# Patient Record
Sex: Female | Born: 1935 | Race: White | Hispanic: No | Marital: Married
Health system: Southern US, Community
[De-identification: ages and names within clinical notes are randomized; demographics above are authoritative.]

---

## 2004-01-19 ENCOUNTER — Inpatient Hospital Stay (HOSPITAL_COMMUNITY): Admission: AD | Admit: 2004-01-19 | Discharge: 2004-01-22 | Payer: Self-pay | Admitting: Cardiology

## 2004-01-19 ENCOUNTER — Ambulatory Visit: Payer: Self-pay | Admitting: Cardiovascular Disease

## 2004-02-13 ENCOUNTER — Encounter: Payer: Self-pay | Admitting: Cardiovascular Disease

## 2004-03-06 ENCOUNTER — Encounter: Payer: Self-pay | Admitting: Cardiovascular Disease

## 2004-04-03 ENCOUNTER — Encounter: Payer: Self-pay | Admitting: Cardiovascular Disease

## 2004-04-16 ENCOUNTER — Ambulatory Visit: Payer: Self-pay | Admitting: Internal Medicine

## 2004-05-04 ENCOUNTER — Encounter: Payer: Self-pay | Admitting: Cardiovascular Disease

## 2004-05-13 ENCOUNTER — Encounter: Payer: Self-pay | Admitting: Cardiovascular Disease

## 2004-06-03 ENCOUNTER — Encounter: Payer: Self-pay | Admitting: Cardiovascular Disease

## 2004-07-04 ENCOUNTER — Encounter: Payer: Self-pay | Admitting: Cardiovascular Disease

## 2004-08-03 ENCOUNTER — Encounter: Payer: Self-pay | Admitting: Cardiovascular Disease

## 2004-09-03 ENCOUNTER — Encounter: Payer: Self-pay | Admitting: Cardiovascular Disease

## 2004-10-04 ENCOUNTER — Encounter: Payer: Self-pay | Admitting: Cardiovascular Disease

## 2004-11-03 ENCOUNTER — Encounter: Payer: Self-pay | Admitting: Cardiovascular Disease

## 2004-12-04 ENCOUNTER — Encounter: Payer: Self-pay | Admitting: Cardiovascular Disease

## 2005-01-03 ENCOUNTER — Encounter: Payer: Self-pay | Admitting: Cardiovascular Disease

## 2005-01-17 ENCOUNTER — Ambulatory Visit: Payer: Self-pay | Admitting: Internal Medicine

## 2005-02-03 ENCOUNTER — Encounter: Payer: Self-pay | Admitting: Cardiovascular Disease

## 2005-03-06 ENCOUNTER — Encounter: Payer: Self-pay | Admitting: Cardiovascular Disease

## 2005-04-03 ENCOUNTER — Encounter: Payer: Self-pay | Admitting: Cardiovascular Disease

## 2005-05-04 ENCOUNTER — Encounter: Payer: Self-pay | Admitting: Cardiovascular Disease

## 2005-06-03 ENCOUNTER — Encounter: Payer: Self-pay | Admitting: Cardiovascular Disease

## 2005-07-04 ENCOUNTER — Encounter: Payer: Self-pay | Admitting: Cardiovascular Disease

## 2005-08-03 ENCOUNTER — Encounter: Payer: Self-pay | Admitting: Cardiovascular Disease

## 2005-09-03 ENCOUNTER — Encounter: Payer: Self-pay | Admitting: Cardiovascular Disease

## 2005-09-10 ENCOUNTER — Ambulatory Visit: Payer: Self-pay | Admitting: Infectious Diseases

## 2005-10-04 ENCOUNTER — Encounter: Payer: Self-pay | Admitting: Cardiovascular Disease

## 2006-09-30 ENCOUNTER — Ambulatory Visit: Payer: Self-pay | Admitting: Obstetrics and Gynecology

## 2006-11-17 ENCOUNTER — Ambulatory Visit: Payer: Self-pay

## 2006-11-19 ENCOUNTER — Ambulatory Visit: Payer: Self-pay | Admitting: Obstetrics and Gynecology

## 2006-12-04 ENCOUNTER — Inpatient Hospital Stay: Payer: Self-pay | Admitting: Obstetrics and Gynecology

## 2007-07-19 ENCOUNTER — Ambulatory Visit: Payer: Self-pay | Admitting: Internal Medicine

## 2007-10-07 ENCOUNTER — Ambulatory Visit: Payer: Self-pay | Admitting: Obstetrics and Gynecology

## 2007-12-10 ENCOUNTER — Encounter: Payer: Self-pay | Admitting: Unknown Physician Specialty

## 2008-06-13 ENCOUNTER — Ambulatory Visit: Payer: Self-pay | Admitting: Obstetrics and Gynecology

## 2008-10-26 ENCOUNTER — Ambulatory Visit: Payer: Self-pay | Admitting: Internal Medicine

## 2009-02-10 ENCOUNTER — Emergency Department: Payer: Self-pay | Admitting: Internal Medicine

## 2009-03-23 ENCOUNTER — Ambulatory Visit: Payer: Self-pay | Admitting: Internal Medicine

## 2009-07-10 ENCOUNTER — Emergency Department: Payer: Self-pay | Admitting: Internal Medicine

## 2009-09-05 ENCOUNTER — Ambulatory Visit: Payer: Self-pay | Admitting: Internal Medicine

## 2010-12-03 ENCOUNTER — Emergency Department: Payer: Self-pay | Admitting: Emergency Medicine

## 2010-12-09 ENCOUNTER — Inpatient Hospital Stay: Payer: Self-pay | Admitting: Vascular Surgery

## 2011-03-07 ENCOUNTER — Ambulatory Visit: Payer: Self-pay | Admitting: Vascular Surgery

## 2011-03-07 LAB — BASIC METABOLIC PANEL
Anion Gap: 13 (ref 7–16)
BUN: 10 mg/dL (ref 7–18)
Calcium, Total: 9.4 mg/dL (ref 8.5–10.1)
EGFR (African American): >60
EGFR (Non-African Amer.): >60
Glucose: 87 mg/dL (ref 65–99)
Osmolality: 289 (ref 275–301)
Sodium: 146 mmol/L — ABNORMAL HIGH (ref 136–145)

## 2011-11-25 ENCOUNTER — Ambulatory Visit: Payer: Self-pay | Admitting: Vascular Surgery

## 2011-11-25 LAB — CREATININE, SERUM
EGFR (African American): >60
EGFR (Non-African Amer.): >60

## 2011-11-25 LAB — BUN: BUN: 10 mg/dL (ref 7–18)

## 2012-01-04 ENCOUNTER — Ambulatory Visit: Payer: Self-pay | Admitting: Internal Medicine

## 2012-01-25 ENCOUNTER — Inpatient Hospital Stay: Payer: Self-pay | Admitting: Vascular Surgery

## 2012-01-25 LAB — APTT
Activated PTT: 28.1 secs (ref 23.6–35.9)
Activated PTT: 71.7 secs — ABNORMAL HIGH (ref 23.6–35.9)

## 2012-01-25 LAB — CBC WITH DIFFERENTIAL/PLATELET
Basophil #: 0.1 10*3/uL (ref 0.0–0.1)
Basophil %: 0.8 %
Eosinophil #: 0 10*3/uL (ref 0.0–0.7)
HCT: 46.5 % (ref 35.0–47.0)
Lymphocyte %: 25.6 %
MCH: 32.2 pg (ref 26.0–34.0)
MCHC: 34.2 g/dL (ref 32.0–36.0)
Monocyte #: 1.1 x10 3/mm — ABNORMAL HIGH (ref 0.2–0.9)
Neutrophil %: 62.5 %
Platelet: 235 10*3/uL (ref 150–440)
RDW: 13.7 % (ref 11.5–14.5)
WBC: 9.9 10*3/uL (ref 3.6–11.0)

## 2012-01-25 LAB — CBC
HCT: 46.7 % (ref 35.0–47.0)
MCH: 32.2 pg (ref 26.0–34.0)
MCHC: 34.2 g/dL (ref 32.0–36.0)
MCV: 94 fL (ref 80–100)
Platelet: 213 10*3/uL (ref 150–440)
RDW: 13.4 % (ref 11.5–14.5)
WBC: 8.6 10*3/uL (ref 3.6–11.0)

## 2012-01-25 LAB — BASIC METABOLIC PANEL
BUN: 8 mg/dL (ref 7–18)
Calcium, Total: 9.5 mg/dL (ref 8.5–10.1)
Chloride: 100 mmol/L (ref 98–107)
Co2: 28 mmol/L (ref 21–32)
Creatinine: 0.53 mg/dL — ABNORMAL LOW (ref 0.60–1.30)
EGFR (African American): >60
EGFR (Non-African Amer.): >60
Glucose: 110 mg/dL — ABNORMAL HIGH (ref 65–99)
Potassium: 3.6 mmol/L (ref 3.5–5.1)
Sodium: 137 mmol/L (ref 136–145)

## 2012-01-25 LAB — PROTIME-INR
INR: 0.9
Prothrombin Time: 12.2 secs (ref 11.5–14.7)

## 2012-01-26 LAB — PROTIME-INR
INR: 0.8
Prothrombin Time: 11.8 secs (ref 11.5–14.7)

## 2012-01-26 LAB — CBC WITH DIFFERENTIAL/PLATELET
Basophil #: 0.1 10*3/uL (ref 0.0–0.1)
Basophil %: 0.6 %
Eosinophil %: 0 %
HCT: 42.6 % (ref 35.0–47.0)
HGB: 14.6 g/dL (ref 12.0–16.0)
Lymphocyte #: 0.9 10*3/uL — ABNORMAL LOW (ref 1.0–3.6)
Lymphocyte %: 17.7 %
MCHC: 34.1 g/dL (ref 32.0–36.0)
MCHC: 33.1 g/dL (ref 32.0–36.0)
MCV: 95 fL (ref 80–100)
MCV: 94 fL (ref 80–100)
Monocyte %: 7.5 %
Neutrophil #: 7 10*3/uL — ABNORMAL HIGH (ref 1.4–6.5)
Neutrophil #: 10.9 10*3/uL — ABNORMAL HIGH (ref 1.4–6.5)
Neutrophil %: 70.1 %
Platelet: 204 10*3/uL (ref 150–440)
Platelet: 182 10*3/uL (ref 150–440)
RBC: 4.5 10*6/uL (ref 3.80–5.20)
RBC: 4.52 10*6/uL (ref 3.80–5.20)
RDW: 13.8 % (ref 11.5–14.5)
RDW: 13.4 % (ref 11.5–14.5)
WBC: 13.1 10*3/uL — ABNORMAL HIGH (ref 3.6–11.0)

## 2012-01-26 LAB — BASIC METABOLIC PANEL
Calcium, Total: 9 mg/dL (ref 8.5–10.1)
Co2: 31 mmol/L (ref 21–32)
EGFR (African American): >60
Osmolality: 275 (ref 275–301)
Sodium: 138 mmol/L (ref 136–145)

## 2012-01-26 LAB — APTT: Activated PTT: 28.8 secs (ref 23.6–35.9)

## 2012-01-26 LAB — HEMOGLOBIN: HGB: 6.5 g/dL — ABNORMAL LOW (ref 12.0–16.0)

## 2012-01-27 LAB — BASIC METABOLIC PANEL
Anion Gap: 7 (ref 7–16)
BUN: 9 mg/dL (ref 7–18)
Calcium, Total: 7.6 mg/dL — ABNORMAL LOW (ref 8.5–10.1)
Co2: 28 mmol/L (ref 21–32)
EGFR (African American): >60
Sodium: 142 mmol/L (ref 136–145)

## 2012-01-27 LAB — CBC WITH DIFFERENTIAL/PLATELET
Comment - H1-Com2: NORMAL
HCT: 30.1 % — ABNORMAL LOW (ref 35.0–47.0)
HGB: 10.1 g/dL — ABNORMAL LOW (ref 12.0–16.0)
Lymphocytes: 13 %
MCHC: 33.6 g/dL (ref 32.0–36.0)
MCV: 88 fL (ref 80–100)
Monocytes: 13 %
RBC: 3.42 10*6/uL — ABNORMAL LOW (ref 3.80–5.20)
Segmented Neutrophils: 59 %
Variant Lymphocyte - H1-Rlymph: 9 %

## 2012-01-28 LAB — CBC WITH DIFFERENTIAL/PLATELET
Basophil #: 0 10*3/uL (ref 0.0–0.1)
Eosinophil %: 0.4 %
HCT: 23.1 % — ABNORMAL LOW (ref 35.0–47.0)
HGB: 8.1 g/dL — ABNORMAL LOW (ref 12.0–16.0)
Lymphocyte %: 17.7 %
Monocyte #: 1.8 x10 3/mm — ABNORMAL HIGH (ref 0.2–0.9)
Monocyte %: 14.8 %
Neutrophil #: 8.1 10*3/uL — ABNORMAL HIGH (ref 1.4–6.5)
RBC: 2.63 10*6/uL — ABNORMAL LOW (ref 3.80–5.20)
RDW: 16.6 % — ABNORMAL HIGH (ref 11.5–14.5)

## 2012-01-28 LAB — COMPREHENSIVE METABOLIC PANEL
Anion Gap: 4 — ABNORMAL LOW (ref 7–16)
Calcium, Total: 7.6 mg/dL — ABNORMAL LOW (ref 8.5–10.1)
Co2: 31 mmol/L (ref 21–32)
EGFR (Non-African Amer.): >60
Glucose: 106 mg/dL — ABNORMAL HIGH (ref 65–99)
Osmolality: 283 (ref 275–301)
Potassium: 3.7 mmol/L (ref 3.5–5.1)
SGOT(AST): 71 U/L — ABNORMAL HIGH (ref 15–37)
Sodium: 143 mmol/L (ref 136–145)

## 2012-01-28 LAB — APTT
Activated PTT: 45.1 secs — ABNORMAL HIGH (ref 23.6–35.9)
Activated PTT: 43.6 secs — ABNORMAL HIGH (ref 23.6–35.9)
Activated PTT: 51.8 secs — ABNORMAL HIGH (ref 23.6–35.9)
Activated PTT: 40.2 secs — ABNORMAL HIGH (ref 23.6–35.9)

## 2012-01-29 LAB — CBC WITH DIFFERENTIAL/PLATELET
Basophil #: 0 10*3/uL (ref 0.0–0.1)
Eosinophil #: 0 10*3/uL (ref 0.0–0.7)
Eosinophil %: 0 %
HCT: 24 % — ABNORMAL LOW (ref 35.0–47.0)
HGB: 8.2 g/dL — ABNORMAL LOW (ref 12.0–16.0)
Lymphocyte #: 1.3 10*3/uL (ref 1.0–3.6)
Lymphocyte %: 8.3 %
MCHC: 34 g/dL (ref 32.0–36.0)
Monocyte %: 11.6 %
Neutrophil #: 12 10*3/uL — ABNORMAL HIGH (ref 1.4–6.5)
Neutrophil %: 79.8 %
Platelet: 156 10*3/uL (ref 150–440)
RDW: 16.3 % — ABNORMAL HIGH (ref 11.5–14.5)
WBC: 15.1 10*3/uL — ABNORMAL HIGH (ref 3.6–11.0)

## 2012-01-29 LAB — BASIC METABOLIC PANEL
Anion Gap: 7 (ref 7–16)
BUN: 7 mg/dL (ref 7–18)
Calcium, Total: 8 mg/dL — ABNORMAL LOW (ref 8.5–10.1)
Chloride: 102 mmol/L (ref 98–107)
Creatinine: 0.54 mg/dL — ABNORMAL LOW (ref 0.60–1.30)
EGFR (African American): >60
EGFR (Non-African Amer.): >60
Osmolality: 277 (ref 275–301)
Potassium: 3.6 mmol/L (ref 3.5–5.1)
Sodium: 139 mmol/L (ref 136–145)

## 2012-01-29 LAB — APTT
Activated PTT: 37.2 secs — ABNORMAL HIGH (ref 23.6–35.9)
Activated PTT: 123.2 secs — ABNORMAL HIGH (ref 23.6–35.9)

## 2012-01-30 LAB — APTT: Activated PTT: >160 secs (ref 23.6–35.9)

## 2012-02-04 ENCOUNTER — Ambulatory Visit: Payer: Self-pay | Admitting: Internal Medicine

## 2012-02-04 DEATH — deceased

## 2014-05-23 NOTE — Op Note (Signed)
PATIENT NAME:  [name] Russell, [name] Russell MR#:  [id number] DATE OF BIRTH:  Apr 10, 1935  DATE OF PROCEDURE:  11/25/2011  PREOPERATIVE DIAGNOSIS: Atherosclerotic occlusive disease bilateral lower extremities with rest pain symptoms and lifestyle limiting claudication.   POSTOPERATIVE DIAGNOSIS: Atherosclerotic occlusive disease bilateral lower extremities with rest pain symptoms and lifestyle limiting claudication.   PROCEDURES PERFORMED:  1. Abdominal aortogram.  2. Right lower extremity distal runoff, third order catheter placement.  3. Percutaneous transluminal angioplasty to 3 mm right peroneal artery.  4. Crosser atherectomy right superficial femoral artery and popliteal artery.  5. Percutaneous transluminal angioplasty to 6 mm right superficial femoral and popliteal artery.   SURGEON: Renford Dills, MD  SEDATION: Versed 4 mg plus fentanyl 150 mcg administered IV. Continuous ECG, pulse oximetry and cardiopulmonary monitoring was performed throughout the entire procedure by the interventional radiology nurse. Total sedation was 1 hour 15 minutes.   ACCESS: 7 French sheath left profunda femoris artery.   CONTRAST USED: Isovue 55 mL.   FLUOROSCOPY TIME: 5.8 minutes.   INDICATIONS: Ms. Broussard is a 79 year old woman who has had several interventions on her right lower extremity in the past. She now has recurrent painful symptoms in her leg. She is having mild rest pain symptoms which are alleviated with dependency. She is also having very short distance claudication and finding it unable to carry on her daily activities including walking around the house without stopping. The risks and benefits for intervention were reviewed. All questions answered. Patient agrees to proceed.   DESCRIPTION OF PROCEDURE: Patient is brought to special procedures, placed in supine position. After adequate sedation is achieved, the left groin is prepped and draped in a sterile fashion. With a hemostat placed in the  groin the femoral head is localized. The femoral artery is very easy to palpate and therefore micropuncture needle is inserted. With fluoroscopy the puncture is at the mid femoral head level. Microwire is then advanced followed by micro sheath, J-wire followed by 5 French sheath and 5 French pigtail catheter. Pigtail catheter is positioned at the level of T12 and AP projection of the aorta is obtained. Pigtail catheter is repositioned to above the bifurcation and LAO projection of the pelvis is obtained. Stiff angled Glidewire and pigtail catheter are used to cross the bifurcation and advance down into the very proximal superficial femoral. RAO projection with magnified view of the right groin is then obtained. The detector is then repositioned to AP and right lower extremity distal runoff is obtained. Within the previously placed stent there are two areas of total occlusion noted. Distal runoff is poorly defined.   Stiff angled Glide is reintroduced and the 5 [place] sheath is exchanged for a 7 [place] Raby. Raby is then advanced down so that its tip is within the proximal one third of the SFA. Wire and dilator are removed. 5000 units of heparin is given. A 0.014 wire is then advanced down to the level of the stent and a sidekick straight catheter is advanced over the wire, wire is removed. The 14S crosser atherectomy device is then prepped on the field. Out of body test is performed and is normal and therefore the 14S is introduced through the sidekick catheter. Using a combination of the catheter with the 14S the entire length of the stent is negotiated including the two areas of total occlusion. At the mid popliteal the S6 is removed and the sidekick catheter is used for injection of contrast which demonstrates intraluminal placement and string sign within the  proximal one third of the peroneal. There is complete nonvisualization of the AP and the posterior tibial arteries throughout their entire course.    Grand slam wire is then reintroduced through the sidekick catheter and positioned with the tip down near the ankle. A 3 x 20 ultra cross balloon is then advanced over the wire and the peroneal artery is ballooned to 3 mm in maximal inflation at 14 atmospheres for two minutes. Prior to doing this 500 mcg of nitroglycerin was given intra-arterially through the sidekick catheter.   Following this 3 mm balloon inflation a 6 x 15 Rival balloon is advanced over the wire and positioned in the stent. The tip of the balloon is positioned just above the distal endpoint of the stent. First inflation is to 14 atmospheres for one minute. The balloon is then deflated, repositioned and inflated a second time covering the entire length of the stent, again to 14 atmospheres for one minute. Balloon is then taken down and removed. Follow-up angiography demonstrates wide patency of the stent with uniform dilatation and 0% residual stenosis. Popliteal distally is preserved and approximately 4 mm in diameter. Minor atherosclerotic changes are noted but no hemodynamically significant disease. Peroneal is now dilated to 3 mm and appears to match quite nicely with no evidence of residual hemodynamically significant stenosis. Distal runoff is preserved.   The sheath is then pulled into the external iliac on the left and oblique view is obtained. Three separate views are obtained, an LAO which overlaps the profunda femoris and the superficial femoral artery. AP which begins to visualize the two but remains overlapped and difficult to interpret and then a 30 degree RAO. In this projection the superficial femoral artery and the profunda femoris artery are clearly defined. Unusual that the RAO projection is the optimal image for the left groin. Nevertheless it does demonstrate that the puncture is actually approximately 1-1/2 to 2 cm distal into the profunda femoris. Profunda femoris measures 6 mm in diameter. Therefore the Raby sheath  is exchanged for a 7 French 11 cm sheath and a Mynx device is deployed with excellent result. There are no immediate complications. It is noted that the bifurcation of the profunda femoris and superficial femoral arteries occurs at the level of the ilioinguinal ligament on the left.   INTERPRETATION: The abdominal aorta is opacified with a bolus injection of contrast. Bilateral nephrograms are noted. Nephrograms are normal in appearance. Right kidney demonstrates a right main renal artery and an inferior pole renal artery, both are widely patent. Left renal artery is single. It is widely patent. There is diffuse atherosclerotic changes of the aortoiliac system but there are no hemodynamically significant stenoses in the common iliac. Internal and external iliac arteries are both widely patent bilaterally.   The right common femoral and profunda femoris is widely patent. Superficial femoral artery is patent in its proximal two thirds. Just distal to the leading edge of the previously placed stent there is a 1 to 2 cm complete occlusion. There is reconstitution of the stent and then a subsequent 1 to 2 cm total occlusion. Distally the stent is patent and the popliteal artery is patent with diffuse disease but it is not hemodynamically significant. As noted above AP and posterior tibial are nonvisualized throughout their entire course. Peroneal demonstrates a string sign initially but is patent distally in its two thirds and fills the foot.   Following angioplasty to 3 mm, there is wide patency of the peroneal artery. Following angioplasty of  the stented popliteal and SFA segment there is wide patency with 0% residual stenosis.   SUMMARY: Successful salvage of the right lower extremity with recanalization of the SFA, popliteal and the peroneal artery.  ____________________________ Renford Dills, MD ggs:cms D: 11/25/2011 12:23:48 ET T: 11/25/2011 12:37:41 ET  JOB#: [id number] cc: Danella Penton,  MD Renford Dills MD ELECTRONICALLY SIGNED 12/16/2011 12:12

## 2014-05-23 NOTE — Op Note (Signed)
PATIENT NAME:  [name] Russell, Tabria MR#:  [id number] DATE OF BIRTH:  [birth year]  DATE OF PROCEDURE:  01/29/2012  PREOPERATIVE DIAGNOSES:  Ischemic right leg with poor venous access.   POSTOPERATIVE DIAGNOSIS:  Ischemic right leg with poor venous access.   PROCEDURES:   1.  Ultrasound guidance for vascular access to right basilic vein.  2.  Fluoroscopic guidance for placement of catheter.  3.  Insertion of peripherally inserted central venous catheter, right arm.  SURGEON:  Annice NeedyJason S. Braxson Hollingsworth, MD  ANESTHESIA:  Local.   ESTIMATED BLOOD LOSS:  Minimal.   INDICATION FOR PROCEDURE:  This is a 79 year old female with an ischemic leg. She has had interventions and had extended ischemic time before presentation and her leg at this point may not be salvageable. She remains in the hospital for ongoing therapy and has poor venous access and we are placing a PICC line for durable venous access.   DESCRIPTION OF PROCEDURE:  The patient's right arm was sterilely prepped and draped, and a sterile surgical field was created. The right basilic vein was accessed under direct ultrasound guidance without difficulty with a micropuncture needle and permanent image was recorded. 0.018 wire was then placed into the superior vena cava. Peel-away sheath was placed over the wire. A single lumen peripherally inserted central venous catheter was then placed over the wire and the wire and peel-away sheath were removed. The catheter tip was placed into the superior vena cava and was secured at the skin at 29 cm with a sterile dressing. The catheter withdrew blood well and flushed easily with heparinized saline. The patient tolerated procedure well.   ____________________________ Annice NeedyJason S. Kani Chauvin, MD jsd:si D: 01/29/2012 15:57:02 ET T: 01/29/2012 23:54:37 ET JOB#: [id number]  cc: Annice NeedyJason S. Nikhil Osei, MD, <Dictator> Annice NeedyJASON S Markavious Micco MD ELECTRONICALLY SIGNED 02/02/2012 12:31

## 2014-05-23 NOTE — Consult Note (Signed)
    Comments   I had a second meeting with pt in the presence of her son, Jonny RuizJohn, and daughter-in-law, Olegario MessierKathy. John and Olegario MessierKathy are pt's shared HCPOA's (document in SCM). I once again explained the options of sgy vs comfort care to pt. She has decided against sgy and family says this is in line with what pt has expressed to them many times in the past. Family is in agreement with pt's decision. Will make pt comfort care and [facility]. Spoke to Dr Gilda CreaseSchnier who agrees with plan.  would like IV removed. She has received 7mg  hydromorphone/24hrs (150mg  morphine equivalents). Will start fentanyl patch at 37mcg and titrate up as needed. Use prn morphine elixir for breakthrough pain. dx: ischemic R.LE   Secondary dx: PAD, CAD  Electronic Signatures: Sheyenne Konz, Harriett SineNancy (MD)  (Signed 27-Dec-13 12:12)  Authored: Palliative Care   Last Updated: 27-Dec-13 12:12 by Sherill Wegener, Harriett SineNancy (MD)

## 2014-05-23 NOTE — H&P (Signed)
Subjective/Chief Complaint pain in the right leg    History of Present Illness The patient is a 79 yo woman well known to the service who presented to the ER with about 6 days of increasing right leg pain.  No trauma.  Has had multiple interventions inthe past the most recent of which was in September.  Known to have SFA and tibial disease.    Past History 1.  CAD s/p coronary intervention 2.  Hypertension 3.  Hypothyroidism 4.  Depression 5.  Chronic pain 6.  Hypercholesterolemia   Past Med/Surgical Hx:  Cholecystectomy:   thyroid:   stents:   angioplasty:   Hysterectomy - Total:   ALLERGIES:  PCN: Rash, Other  Vytorin: Unknown  Statins: Unknown  Lipitor: Unknown  Codeine: Headaches  HOME MEDICATIONS: Medication Instructions Status  simvastatin 40 mg oral tablet 1 tab(s) orally once a day (at bedtime) Active  galantamine 4 mg oral tablet 1 tab(s) orally 2 times a day Active  levothyroxine 75 mcg (0.075 mg) oral tablet 1 tab(s) orally once a day Active  metoprolol tartrate 50 mg oral tablet 0.5 tab (77m) orally 2 times a day. Active  Nitrostat 0.4 mg sublingual tablet 1 tab(s) sublingual every 5 minutes up to 3 doses as needed for chest pain. **if no relief call md or go to emergency room**  Active  clopidogrel 75 mg oral tablet 1 tab(s) orally once a day Active  tramadol 50 mg oral tablet 1 tab(s) orally 3 times a day as needed for pain.  Active  acetaminophen 500 mg oral tablet 2 tab(s) orally every 6 hours as needed for sinus headache.  Active  Afrin 0.05% nasal spray 2 spray(s) in each nostril 2 times a day as needed for sinus. Active  temazepam 30 mg oral capsule 1 cap(s) orally once a day (at bedtime) Active   Family and Social History:   Family History Non-Contributory    Social History negative tobacco, negative ETOH, negative Illicit drugs    Place of Living Home   Review of Systems:   Subjective/Chief Complaint right leg pain    Fever/Chills No     Cough No    Sputum No    Abdominal Pain No    Diarrhea No    Constipation No    Nausea/Vomiting No    SOB/DOE No    Chest Pain No    Telemetry Reviewed NSR    Dysuria No    Tolerating Diet Yes   Physical Exam:   GEN well developed, well nourished, moderate distress    HEENT PERRL, hearing intact to voice, dry oral mucosa    NECK supple  trachea midline    RESP normal resp effort  no use of accessory muscles    CARD regular rate  no carotid bruits  no JVD    ABD denies tenderness  denies Flank Tenderness  soft    EXTR positive cyanosis/clubbing, negative edema, right foot mottled and cold poor cap refill;  Nonpalpable pedal pulses bilaterally    SKIN positive rashes, No ulcers, skin turgor poor    NEURO cranial nerves intact, follows commands, motor/sensory function intact    PSYCH alert, A+O to time, place, person, poor insight   Lab Results: Routine Chem:  22-Dec-13 08:15    Glucose, Serum  110   BUN 8   Creatinine (comp)  0.53   Sodium, Serum 137   Potassium, Serum 3.6   Chloride, Serum 100   CO2, Serum 28  Calcium (Total), Serum 9.5   Anion Gap 9   Osmolality (calc) 273   eGFR (African American) >60   eGFR (Non-African American) >60 (eGFR values <60m/min/1.73 m2 may be an indication of chronic kidney disease (CKD). Calculated eGFR is useful in patients with stable renal function. The eGFR calculation will not be reliable in acutely ill patients when serum creatinine is changing rapidly. It is not useful in  patients on dialysis. The eGFR calculation may not be applicable to patients at the low and high extremes of body sizes, pregnant women, and vegetarians.)  Routine Coag:  22-Dec-13 08:15    Activated PTT (APTT) 28.1 (A HCT value >55% may artifactually increase the APTT. In one study, the increase was an average of 19%. Reference: "Effect on Routine and Special Coagulation Testing Values of Citrate Anticoagulant Adjustment in Patients  with High HCT Values." American Journal of Clinical Pathology 2006;126:400-405.)   Prothrombin 12.2   INR 0.9 (INR reference interval applies to patients on anticoagulant therapy. A single INR therapeutic range for coumarins is not optimal for all indications; however, the suggested range for most indications is 2.0 - 3.0. Exceptions to the INR Reference Range may include: Prosthetic heart valves, acute myocardial infarction, prevention of myocardial infarction, and combinations of aspirin and anticoagulant. The need for a higher or lower target INR must be assessed individually. Reference: The Pharmacology and Management of the Vitamin K  antagonists: the seventh ACCP Conference on Antithrombotic and Thrombolytic Therapy. CAVWPV.9480Sept:126 (3suppl): [id number] A HCT value >55% may artifactually increase the PT.  In one study,  the increase was an average of 25%. Reference:  "Effect on Routine and Special Coagulation Testing Values of Citrate Anticoagulant Adjustment in Patients with High HCT Values." American Journal of Clinical Pathology 2006;126:400-405.)  Routine Hem:  22-Dec-13 08:15    WBC (CBC) 8.6   RBC (CBC) 4.96   Hemoglobin (CBC) 16.0   Hematocrit (CBC) 46.7   Platelet Count (CBC) 213 (Result(s) reported on 25 Jan 2012 at 08:31AM.)   MCV 94   MCH 32.2   MCHC 34.2   RDW 13.4     Assessment/Admission Diagnosis 1.  Ischemic right leg with rest pain         patient is admitted for pain control and anticoagulation          Angiography with intervention in the am         possible amputation discussed. 2.   Coronary artery disease         continue beta blocker         NTG prn 3.   Hypertension          continue home meds 4.  Hypercholesterolemia          has tolerated simvastatin          begin fish oil/flax seed oil 5.  Depression          continue antidepresants    Plan level 3 admission   Electronic Signatures: SHortencia Pilar(MD)  (Signed 22-Dec-13  15:43)  Authored: CHIEF COMPLAINT and HISTORY, PAST MEDICAL/SURGIAL HISTORY, ALLERGIES, HOME MEDICATIONS, FAMILY AND SOCIAL HISTORY, REVIEW OF SYSTEMS, PHYSICAL EXAM, LABS, ASSESSMENT AND PLAN   Last Updated: 22-Dec-13 15:43 by SHortencia Pilar(MD)

## 2014-05-26 NOTE — Discharge Summary (Signed)
PATIENT NAME:  [name] Russell, [name] MR#:  [id number] DATE OF BIRTH:  12/08/1935  DATE OF ADMISSION:  01/25/2012 DATE OF TRANSFER TO HOSPICE:  01/31/2012  DISCHARGE DIAGNOSIS:  Unreconstructable ischemia of the right leg.  SECONDARY DIAGNOSES: 1.  Coronary artery disease, history of coronary intervention.  2.  Hypertension.  3.  Hypothyroidism.  4.  Depression.  5.  Chronic pain syndrome.  6.  Hypercholesterolemia.   HISTORY OF PRESENT ILLNESS:   On December 22nd, the patient presented to the ER with an approximate 6 day history of right lower extremity pain. She also noticed several days of discoloration and blue mottling of her foot. She was evaluated in the ER and found to have a profoundly ischemic foot with loss of sensation and  motor function in the forefoot and was admitted to the hospital.   HOSPITAL COURSE:  On December 23rd, the patient underwent angiography.  The occluded arteries were crossed with a wire and she underwent approximately 6 hours of TPA therapy. This was ineffectual.  Mechanical thrombolysis was subsequently tried with some improvement and 2 stents were placed in the popliteal and SFA.  Angioplasty of the peroneal was performed. At the conclusion of the procedure a StarClose device was deployed. Approximately 1 hour after the procedure, she was noted to have the abrupt onset of pain in the left groin, the site of the access.  She also had a significant drop in her blood pressure. She was evaluated and taken for emergent repair of bleeding access site in the left groin. Following repair, she returned to normotensive level but with some pressors.  She was noted to have return of the mottling and discoloration of her foot. Evaluation of the films demonstrated that she did not have a bypass option and it is apparent, given this course, that intervention was ineffectual. Over the ensuing several days, discussions regarding treatment options were undertaken. The option of amputation  versus hospice care was discussed in detail.  Dr. Harvie JuniorPhifer of palliative care was consulted to assist in this discussion as the patient was somewhat noncommittal; however, over the past 24 hours, the patient has decided to forgo amputation and has accepted hospice care. She is being transferred to hospice later today.     ____________________________ Renford DillsGregory G. Schnier, MD ggs:ct D: 01/31/2012 11:38:55 ET T: 02/01/2012 06:57:45 ET JOB#: [id number]  cc: Renford DillsGregory G. Schnier, MD, <Dictator> Ned GraceNancy Phifer, MD Danella PentonMark F. Miller, MD  Renford DillsGREGORY G SCHNIER MD ELECTRONICALLY SIGNED 02/10/2012 11:18

## 2014-05-26 NOTE — Op Note (Signed)
PATIENT NAME:  [name] Russell, [name] Russell MR#:  [id number] DATE OF BIRTH:  [birth year]  DATE OF PROCEDURE:  01/26/2012  PREOPERATIVE DIAGNOSES:  1.  Left groin hematoma.  2.  Complication of vascular device.  3.  Hypotension secondary to blood loss, left groin hematoma.  4.  Atherosclerotic occlusive disease of bilateral lower extremities with rest pain of right lower extremity.   POSTOPERATIVE DIAGNOSES:  1.  Left groin hematoma.  2.  Complication of vascular device.  3.  Hypotension secondary to blood loss, left groin hematoma.  4.  Atherosclerotic occlusive disease of bilateral lower extremities with rest pain of right lower extremity.   PROCEDURE PERFORMED: Emergent left groin exploration with repair left common femoral artery.   SURGEON: Levora DredgeGregory Kenzie Flakes, MD.   ANESTHESIA: General by endotracheal intubation.   FLUIDS: Per anesthesia record.   ESTIMATED BLOOD LOSS: 200 mL.   SPECIMEN: None.   INDICATIONS: The patient is a 79 year old woman, who has been undergoing attempts at limb salvage of the right lower extremity. At the conclusion of the follow-up angiography and intervention this afternoon, a StarClose device was deployed. Initially she did well; however, subsequently she complained of some pain approximately 1 hour after closure. She was then noted to become hypotensive. Physical examination was suspicious for expanding hematoma and the patient was taken emergently to the operating room for repair. The risks and benefits were reviewed with both the patient and also with the family. All questions were answered. All were in agreement with proceeding.   DESCRIPTION OF PROCEDURE: The patient is taken to the operating room and placed in the supine position. After adequate general anesthesia is induced, the left groin was prepped and draped in sterile fashion. A vertical incision was then created and the dissection carried down to expose the femoral artery. Femoral artery was then dissected in  the proximal fashion until the bleeding point was identified. A single 5-0 Prolene U stitch was placed and this stopped the bleeding. The wound was then inspected for further evidence of any bleeding. A StarClose device was identified, but was not on the adventitia of the artery, but in the tissues just above it. I am uncertain as to whether this was its deployment site or whether it deployed onto the artery and then had  come free or popped off.  The wound was then irrigated with 500 mL of saline. Surgicel was placed along the artery itself and then the wound was closed in multiple layers using several layers of 2-0 Vicryl in a running fashion followed by several layers of 3-0 Vicryl in running fashion followed by 4-0 Monocryl subcuticular and then Dermabond. The patient tolerated the procedure fairly well, in fact, once the bleeding had been controlled her pressure rose into the 130s systolics with minimal resuscitation. She is taken to the intensive care unit in guarded condition.   ____________________________ Renford DillsGregory G. Waneda Klammer, MD ggs:aw D: 01/26/2012 21:50:21 ET T: 01/27/2012 09:28:59 ET JOB#: [id number]  cc: Renford DillsGregory G. Ziyon Soltau, MD, <Dictator> Renford DillsGREGORY G Maybelle Depaoli MD ELECTRONICALLY SIGNED 02/10/2012 11:18

## 2014-05-26 NOTE — Op Note (Signed)
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   PROCEDURES PERFORMED: 1. Right lower extremity follow-up angiography, third order catheter placement.  2. Mechanical thrombectomy with AngioJet, right superficial femoral artery and popliteal.  3. Percutaneous transluminal angioplasty with stent placement, right superficial femoral artery and popliteal artery. 4. Percutaneous transluminal angioplasty of peroneal to 3 mm.  SURGEON:  Renford DillsGregory G. Thaison Kolodziejski, M.D.   SEDATION: Versed 3 mg plus fentanyl 100 mcg administered IV. Continuous ECG, pulse oximetry and cardiopulmonary monitoring was performed throughout the entire procedure by the interventional radiology nurse. Total sedation time was 1 hour, 45 minutes.   ACCESS: Existing 6 French sheath, left common femoral artery.   FLUORO TIME: Approximately 10 minutes.   CONTRAST USED: Isovue 50 to 60 mL.   INDICATIONS: [name] Russell is being treated for her ischemic right lower extremity. She now is undergoing her follow-up angiography with the hope for intervention. Risks and benefits were reviewed, all questions are answered and the patient has agreed to proceed, again noting that she will never consent to amputation but would except death in lieu of amputation.   DESCRIPTION OF PROCEDURE: The patient is taken to special procedures, returned to the table and her catheter and left groin are prepped and draped in sterile fashion. The wire is then removed, a Magic torque wire is advanced through the infusion catheter and the infusion catheter is removed, and a straight glide catheter is advanced over the wire. It is positioned in the  distal popliteal and hand injection of contrast is then utilized to demonstrate there are now multiple areas of narrowing in the thrombus of the peroneal. There is continued disadvantaged outflow. There are in fact worse changes compared with the pre-lytic angiography. Angiography within the previously stented SFA and distal popliteal also demonstrates persistent thrombus and no flow.   The AngioJet is then opened onto the field, prepped on the back table, and then multiple passes beginning at the proximal SFA and extending down into the distal popliteal are performed for a total of three passes that are made. Follow-up angiography after actually demonstrates that there is now flow through the stent, in the popliteal, and there are persistent areas of deficit within the peroneal artery. After reviewing these images, a 4 mm balloon is used to reangioplasty the popliteal, a 6 mm balloon is used to re-angio the SFA. These result in absolutely no change. In fact, you could say there was some worsening. Therefore, over the 0.014 wire, a catheter is reintroduced and, with a Tuohy Borst, beginning distally, hand injection of contrast is utilized to demonstrate the anatomy. Given these new focused images, there appears to be a flow-limiting lesion, in the popliteal, just below the stent. There is also an area of flow limitation in the proximal segment of the stent and the SFA just above it.   Heparin, 5000 units, was given at the beginning of the procedure, in two separate doses, 3000 and 2000. A 6 x 80 LifeStent is then advanced over the wire and deployed across the proximal lesion, a 6 x 40 LifeStent is deployed over the distal lesion extending the stent down into the popliteal by another several centimeters, a 4 x 60 balloon is advanced and postdilated  in the distal new stent and a 6 x 20 mm, which was the balloon used before, is used to post dilate the proximal stent. Follow-up angiography now demonstrates brisk  flow through the stented segment, however, there is still suboptimal result in the popliteal and a 5 x 3 balloon is used to angioplasty this area now with a much improved result. Distally there now appears to be significant limitation in the peroneal and a 3 x 15 balloon is advanced down to cover the proximal one third of the peroneal and then angioplastied to 12 atmospheres for one minute. Follow-up angiography now demonstrates that there is flow through this segment with no visible residual stenosis. Distal shots still show significant distal disease, there is still nonfilling of the lateral plantar or the dorsalis pedis, although the peroneal is now patent down to its terminus, and there nonfilling of the pedal arch. Based on this and the fact that the lesions within the proximal peroneal as well as within the popliteal and the SFA now appear to be well treated without a hemodynamically significant stenosis, I felt that Integrilin would be optimal therapy to try to recruit more distal outflow and elected to terminate the procedure and initiate Integrilin and Integrilin bolus was given, sheath is pulled back into the left external iliac, oblique view is obtained, and a StarClose device is deployed under fluoroscopic guidance. There were no immediate complications and it should be noted that a total of approximately 1500 mcg of nitroglycerin was given intra-arterially during this procedure.  The patient was taken to the recovery area in stable condition. ____________________________ Renford Dills, MD ggs:sb D: 01/26/2012 19:13:13 ET T: 01/27/2012 09:38:51 ET JOB#: [id number]  cc: Renford Dills, MD, <Dictator> Renford Dills MD ELECTRONICALLY SIGNED 02/10/2012 11:18

## 2014-05-26 NOTE — Op Note (Signed)
PATIENT NAME:  [name] Russell, Trevia MR#:  [id number] DATE OF BIRTH:  18-May-1935  DATE OF PROCEDURE:  01/26/2012  PREOPERATIVE DIAGNOSES:  1.  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  2.  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   POSTOPERATIVE DIAGNOSES: 1.  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  2.  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   PROCEDURE PERFORMED: Insertion of right IJ triple lumen catheter with ultrasound guidance.   SURGEON: Levora DredgeGregory Erion Weightman, MD.   PROCEDURE: At the conclusion of her initial angiogram prior to initiation of TPA infusion, the right neck was prepped and draped in a sterile fashion. Ultrasound is placed in a sterile sleeve. Jugular vein was identified. It is echolucent and compressible indicating patency. Image was recorded for the permanent record. Micropuncture needle was then inserted into the jugular vein after 1% lidocaine had been infiltrated into the skin. The puncture was made under direct ultrasound visualization. Microwire followed by micro sheath and J-wire followed by dilator. Triple-lumen catheter was then fed. All three lumens were aspirated and flushed easily and the catheter was secured to the skin of the neck with a 2-0 silk. Sterile dressing was applied. The patient tolerated the procedure well. Chest x-ray was no evidence of hemopneumothorax with line in good position.    ____________________________ Renford DillsGregory G. Marjie Chea, MD ggs:aw D: 01/26/2012 21:52:00 ET T: 01/27/2012 09:36:25 ET JOB#: [id number]  cc: Renford DillsGregory G. Isaiha Asare, MD, <Dictator> Renford DillsGREGORY G Doyce Saling MD ELECTRONICALLY SIGNED 02/10/2012 11:18

## 2014-05-26 NOTE — Op Note (Signed)
PATIENT NAME:  [name] Russell, REGNIER MR#:  [id number] DATE OF BIRTH:  04-Aug-1935  DATE OF PROCEDURE:  01/26/2012  PREOPERATIVE DIAGNOSIS: Atherosclerotic occlusive disease bilateral lower extremities with rest pain of the right lower extremity.   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   PROCEDURES PERFORMED:  1.  Abdominal aortogram.  2.  Right lower extremity distal runoff, third order catheter placement.  3.  Percutaneous transluminal angioplasty of the right peroneal artery.  4.  Initiation of TPA lytic therapy.   SURGEON: Renford Dills, MD.  ANESTHESIA: Versed 5 mg plus fentanyl 200 mcg administered IV. Continuous ECG, pulse oximetry and cardiopulmonary monitoring is performed throughout the entire procedure by the interventional radiology nurse. Total sedation time is 1 hour, 30 minutes.   ACCESS: 6-French sheath, left common femoral artery.   FLUOROSCOPY TIME: Approximately 10 minutes.   CONTRAST USED: Isovue approximately 50 mL.   INDICATIONS: The patient is a 79 year old woman, who presented to the Emergency Room on Sunday, January 25, 2012 with a history of 6 days of increasing pain in her right lower extremity as well as bluish cyanotic changes to the toes and forefoot. She has had several interventions in the past for similar complaints of rest pain, the most recent of which was at the end of September, early October. I am uncertain as to why she waited so long to present. The risks and benefits for angiography were reviewed. All questions have been answered. The patient states that she wishes for everything to be done, but she will not ever consent to amputation even if it costs her her life.   DESCRIPTION OF PROCEDURE: The patient is taken to special procedures and placed in the supine position. After adequate sedation is achieved, left groin is prepped and draped in a sterile fashion.  Ultrasound is placed in a sterile sleeve. Femoral artery is identified. It is echolucent and pulsatile indicating patency. Image is recorded for the permanent record. Under direct ultrasound visualization, 1% lidocaine was infiltrated in the soft tissues and a micropuncture needle was used to access the common femoral artery. Microwire followed by micro sheath, J-wire followed by 5-French sheath and 5-French pigtail catheter. The pigtail catheter is positioned at the level of T12 and AP projection of the aorta is obtained. Pigtail catheter is repositioned to above the bifurcation and LAO projection of the pelvis is obtained. A stiff angled Glidewire and pigtail catheter are used to cross the bifurcation positioned in the common femoral and AP runoff is then obtained. There is occlusion approximately 6 cm proximal to the previously placed SFA stent. There is reconstitution of the popliteal for a short segment and then there is re-occlusion and non-visualization of all tibial vessels.   Heparin 3000 units are given. Stiff angled Glidewire is then advanced through the pigtail catheter and the catheter exchanged for a straight slip catheter. The wire then engages the occlusion quite easily; therefore, a 6-French Ansel sheath is advanced in exchange for the 5-French sheath up and over and positioned with the tip in the mid common femoral artery. The straight catheter is then reintroduced and using the straight catheter and the Glidewire is advanced down to the popliteal at the level of this island. Wire is removed and blood is aspirated. AP projection distally is then obtained by hand injection demonstrating occlusion of the peroneal and its proximal one third. There is reconstitution of a small peroneal that appears patent down to  the foot. Anterior tibial and posterior tibial are non-visualized. Subsequently, even with distal peroneal injections, there is non-visualization of the dorsalis pedis or the lateral  plantar.   Given the recent intervention and the length of occlusion, I elected to exchange the Glidewire for a 0.014 wire. A 2.5 mm x 15 cm balloon is then advanced over the wire and the peroneal artery is angioplastied to 12 atmospheres for 1 minute. Follow-up angiography through a catheter positioned in the tibioperoneal trunk demonstrates that the peroneal artery is now widely patent and free of any hemodynamically significant disease throughout its entire course. Wire is reintroduced, catheter is removed and a 50 cm length infusion catheter is advanced over the wire. I felt TPA infusion, given the length of the SFA occlusion and the recent intervention suggesting there would be a large burden of thrombus, that short-term TPA would be optimal, especially given the very disadvantaged distal outflow. I was also hoping the TPA might actually improve the distal outflow and allow for visualization of the dorsalis pedis and lateral plantar vessels and some vessels that would fill the pedal arch. TPA was then initiated at 1 mg/h with heparin through the sheath at 400 units/h. She was taken to the preoperative area where she will undergo 6 hours of lysis and then re-intervention. The catheter and sheath were then secured to the thigh on the left. The patient tolerated the procedure well and there were no immediate complications. ____________________________ Renford DillsGregory G. Xcaret Morad, MD ggs:aw D: 01/26/2012 19:06:01 ET T: 01/27/2012 09:15:00 ET JOB#: [id number]  cc: Renford DillsGregory G. Phebe Dettmer, MD, <Dictator> Renford DillsGREGORY G Eppie Barhorst MD ELECTRONICALLY SIGNED 02/10/2012 11:18

## 2014-05-28 NOTE — Op Note (Signed)
PATIENT NAME:  [name] Russell, [name] Russell MR#:  [id number] DATE OF BIRTH:  [birth year]  DATE OF PROCEDURE:  03/07/2011  PREOPERATIVE DIAGNOSIS: Atherosclerotic occlusive disease bilateral lower extremities with rest pain of the right lower extremity.   POSTOPERATIVE DIAGNOSIS: Atherosclerotic occlusive disease bilateral lower extremities with rest pain of the right lower extremity.    PROCEDURES PERFORMED:  1. Abdominal aortogram.  2. Right lower extremity runoff, third order catheter placement.  3. Percutaneous transluminal angioplasty of 3 mm right peroneal artery.   SURGEON: Renford Dills, MD  SEDATION: Versed 4 mg plus fentanyl 200 mcg administered IV. Continuous ECG, pulse oximetry and cardiopulmonary monitoring was performed throughout the entire procedure by the interventional radiology nurse. Total sedation time is 45 minutes.   ACCESS: 6 French sheath left common femoral artery.   CONTRAST USED: Isovue 63 mL.   FLUOROSCOPY TIME: 5.1 minutes.   INDICATIONS: Ms. Salonga is a 79 year old woman who presented to the office with increasing complaints of pain in the right foot and leg. Physical examination as well as noninvasive studies demonstrated severe atherosclerotic occlusive disease consistent with rest pain. The risks and benefits for angiography and intervention were reviewed. All questions are answered. Patient has agreed to proceed.   DESCRIPTION OF PROCEDURE: Patient is taken to special procedures, placed in supine position. After adequate sedation is achieved, the left groin was prepped and draped in sterile fashion. 1% lidocaine was infiltrated in the soft tissues and access to the left common femoral artery micropuncture followed by microwire, micro sheath, followed by J-wire and then 5 French sheath. 5 French pigtail catheter is advanced and positioned at the level of T12. Bolus injection of contrast was performed with the imager in the AP projection. Pigtail catheter is  repositioned to above the bifurcation and LAO projection was obtained of the right iliacs. The Advantage wire and the pigtail catheter were then used to cross the bifurcation and advance down into the superficial femoral where distal runoff was obtained. This represents third order catheter placement. Occlusion of the peroneal was identified and 5000 unit of heparin was given. Advantage wire was reintroduced and a 55 cm 6 French Raby sheath was advanced up and over the bifurcation without difficulty. Using a straight glide catheter and the Advantage wire the peroneal stenosis was crossed. Hand injection of contrast through the catheter verified intraluminal positioning and complete distal runoff. 0.014 grand slam wire was then introduced and a 3 x 10 balloon advanced across the lesion, inflation was to 14 atmospheres for one minute. Follow-up angiography demonstrates complete resolution of the stenosis with less than 5% residual stenosis.   Wire was removed and the sheath was pulled into the left external iliac, oblique view was obtained and StarClose device was deployed successfully. There were no immediate complications.   INTERPRETATION: The abdominal aorta, bilateral common iliacs and external iliac arteries are widely patent.   The common femoral and profunda femoris are widely patent. Superficial femoral artery is patent in its proximal one third in its distal and proximal mid one thirds. Distal one third demonstrates previously placed stents which are widely patent. Popliteal artery is widely patent as well. Stents are located in the [facility] canal region extending to the above-knee popliteal. The trifurcation demonstrates heavy disease. Tibioperoneal trunk is patent but there is occlusion of the peroneal in proximal 4 to 5 cm with disease slightly farther down. Anterior tibial and posterior tibial are occluded at their origins. Distally there is peroneal vessel runoff with reconstitution at the dorsalis  pedis filling the pedal arch.   Following angioplasty there is wide patency of the peroneal throughout its course.   SUMMARY: Successful angioplasty of the peroneal artery for limb salvage.  ____________________________ Renford DillsGregory G. Schnier, MD ggs:cms D: 03/07/2011 15:55:35 ET T: 03/07/2011 16:34:24 ET JOB#: [id number] cc: Renford DillsGregory G. Schnier, MD, <Dictator> Renford DillsGREGORY G SCHNIER MD ELECTRONICALLY SIGNED 03/31/2011 10:32

## 2014-06-30 IMAGING — XA IR VASCULAR PROCEDURE
1 series · 1 of 1 positions shown · non-contrast
Comparison: none

[Series 1: single · 1 of 1 slices shown]
[im 1/1]
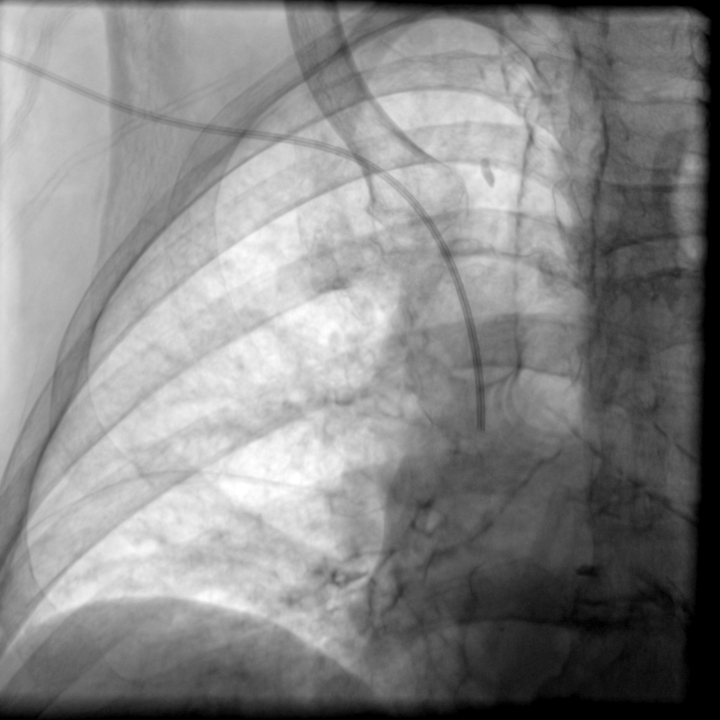

[1 of 1 positions shown; findings below may reference images not displayed]

IMAGES IMPORTED FROM THE [employer] WORKFLOW SYSTEM
NO DICTATION FOR STUDY
